# Patient Record
Sex: Female | Born: 1995 | Race: Black or African American | Hispanic: No | Marital: Single | Smoking: Never smoker
Health system: Southern US, Community
[De-identification: ages and names within clinical notes are randomized; demographics above are authoritative.]

---
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
## 2022-02-01 ENCOUNTER — Ambulatory Visit
Admission: EM | Admit: 2022-02-01 | Discharge: 2022-02-01 | Disposition: A | Discharge status: Home / Self Care | Payer: BC Managed Care – PPO | Attending: Emergency Medicine | Admitting: Emergency Medicine

## 2022-02-01 ENCOUNTER — Other Ambulatory Visit: Payer: Self-pay

## 2022-02-01 DIAGNOSIS — J020 Streptococcal pharyngitis: Secondary | ICD-10-CM | POA: Diagnosis not present

## 2022-02-01 LAB — GROUP A STREP BY PCR: Group A Strep by PCR: DETECTED — AB

## 2022-02-01 MED ORDER — AMOXICILLIN 250 MG/5ML PO SUSR: 500.0000 mg | Freq: Two times a day (BID) | ORAL | 0 refills | Status: AC

## 2022-02-01 MED ORDER — LIDOCAINE VISCOUS HCL 2 % MT SOLN: 15.0000 mL | OROMUCOSAL | 0 refills | Status: AC | PRN

## 2022-02-01 MED ORDER — IBUPROFEN 800 MG PO TABS: 800.0000 mg | ORAL_TABLET | Freq: Three times a day (TID) | ORAL | 0 refills | Status: AC

## 2022-02-01 NOTE — ED Triage Notes (Signed)
Patient presents to Urgent Care with complaints of sore throat, swollen lymph nodes, fatigue since yesterday. Treating symptoms with dayquil with no relief.

## 2022-02-01 NOTE — Discharge Instructions (Addendum)
Your strep test today was positive  Take amoxicillin twice a day for the next 10 days  You may gargle and spit lidocaine solution every 4 hours as needed for temporary relief of your sore throat  May use ibuprofen every 8 hours as needed in addition to Tylenol for additional comfort  You may follow-up at urgent care as needed

## 2022-02-01 NOTE — ED Provider Notes (Signed)
[facility]    CSN: [id number] Arrival date & time: 02/01/22  1828      History   Chief Complaint Chief Complaint  Patient presents with   Fever   Sore Throat    HPI Nicole Dickerson is a 26 y.o. female.   Patient presents with fever, sore throat, fatigue and swelling if notes for 1 day.  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  Unable to tolerate solid foods but tolerating softer foods and liquids.  Has attempted use of NyQuil which has not been helpful.  No pertinent medical history.  Denies nasal congestion, rhinorrhea, ear pain, headaches, cough, shortness of breath, wheezing, abdominal pain, nausea, vomiting, diarrhea.  History reviewed. No pertinent past medical history.  There are no problems to display for this patient.   History reviewed. No pertinent surgical history.  OB History   No obstetric history on file.      Home Medications    Prior to Admission medications   Not on File    Family History History reviewed. No pertinent family history.  Social History Social History   Tobacco Use   Smoking status: Never   Smokeless tobacco: Never  Vaping Use   Vaping Use: Never used  Substance Use Topics   Alcohol use: Yes    Comment: 1x a week   Drug use: Never     Allergies   Patient has no known allergies.   Review of Systems Review of Systems  Constitutional:  Positive for fever. Negative for activity change, appetite change, chills, diaphoresis, fatigue and unexpected weight change.  HENT:  Positive for sore throat. Negative for congestion, dental problem, drooling, ear discharge, ear pain, facial swelling, hearing loss, mouth sores, nosebleeds, postnasal drip, rhinorrhea, sinus pressure, sinus pain, sneezing, tinnitus, trouble swallowing and voice change.   Respiratory: Negative.    Cardiovascular: Negative.   Gastrointestinal: Negative.   Skin: Negative.   Neurological: Negative.     Physical Exam Triage Vital  Signs ED Triage Vitals  Enc Vitals Group     BP 02/01/22 1843 (!) 146/91     Pulse Rate 02/01/22 1843 (!) 117     Resp 02/01/22 1843 16     Temp 02/01/22 1843 (!) 101.3 F (38.5 C)     Temp Source 02/01/22 1843 Oral     SpO2 02/01/22 1843 98 %     Weight 02/01/22 1841 135 lb (61.2 kg)     Height 02/01/22 1841 5\' 3"  (1.6 m)     Head Circumference --      Peak Flow --      Pain Score 02/01/22 1841 6     Pain Loc --      Pain Edu? --      Excl. in GC? --    No data found.  Updated Vital Signs BP (!) 146/91 (BP Location: Right Arm)    Pulse (!) 117    Temp (!) 101.3 F (38.5 C) (Oral)    Resp 16    Ht 5\' 3"  (1.6 m)    Wt 135 lb (61.2 kg)    LMP 01/18/2022    SpO2 98%    BMI 23.91 kg/m   Visual Acuity Right Eye Distance:   Left Eye Distance:   Bilateral Distance:    Right Eye Near:   Left Eye Near:    Bilateral Near:     Physical Exam Constitutional:      Appearance: She is well-developed.  HENT:  Head: Normocephalic.     Right Ear: Tympanic membrane and ear canal normal.     Left Ear: Tympanic membrane and ear canal normal.     Nose: No congestion or rhinorrhea.     Mouth/Throat:     Mouth: Mucous membranes are moist.     Pharynx: Posterior oropharyngeal erythema present.     Tonsils: No tonsillar exudate. 1+ on the right. 1+ on the left.  Cardiovascular:     Rate and Rhythm: Regular rhythm. Tachycardia present.     Heart sounds: Normal heart sounds.  Pulmonary:     Effort: Pulmonary effort is normal.     Breath sounds: Normal breath sounds.  Musculoskeletal:     Cervical back: Normal range of motion.  Lymphadenopathy:     Cervical: Cervical adenopathy present.  Skin:    General: Skin is warm and dry.  Neurological:     General: No focal deficit present.     Mental Status: She is alert and oriented to person, place, and time.  Psychiatric:        Mood and Affect: Mood normal.        Behavior: Behavior normal.     UC Treatments / Results  Labs (all  labs ordered are listed, but only abnormal results are displayed) Labs Reviewed  GROUP A STREP BY PCR    EKG   Radiology No results found.  Procedures Procedures (including critical care time)  Medications Ordered in UC Medications - No data to display  Initial Impression / Assessment and Plan / UC Course  I have reviewed the triage vital signs and the nursing notes.  Pertinent labs & imaging results that were available during my care of the patient were reviewed by me and considered in my medical decision making (see chart for details).  Strep pharyngitis  Fever of 101.3 with associated tachycardia noted in triage, declined in office medication use, endorses that she will use Tylenol at home, strep confirmed via PCR, discussed findings with patient, amoxicillin 10-day course prescribed as well as lidocaine viscus and ibuprofen for management of fever and discomfort, may attempt use of salt water gargles, throat lozenges, warm liquids, teaspoons of honey in addition, urgent care follow-up as needed Final Clinical Impressions(s) / UC Diagnoses   Final diagnoses:  None   Discharge Instructions   None    ED Prescriptions   None    PDMP not reviewed this encounter.   Valinda Hoar, [place] 02/01/22 [phone]

## 2022-02-01 NOTE — ED Notes (Signed)
Refused Tylenol. Pt states she will wait till she gets home.

## 2022-02-23 ENCOUNTER — Other Ambulatory Visit: Payer: Self-pay

## 2022-02-23 ENCOUNTER — Ambulatory Visit
Admission: EM | Admit: 2022-02-23 | Discharge: 2022-02-23 | Disposition: A | Discharge status: Home / Self Care | Payer: BC Managed Care – PPO | Attending: Emergency Medicine | Admitting: Emergency Medicine

## 2022-02-23 DIAGNOSIS — J069 Acute upper respiratory infection, unspecified: Secondary | ICD-10-CM | POA: Diagnosis present

## 2022-02-23 LAB — GROUP A STREP BY PCR: Group A Strep by PCR: NOT DETECTED

## 2022-02-23 MED ORDER — IPRATROPIUM BROMIDE 0.06 % NA SOLN: 2.0000 | Freq: Four times a day (QID) | NASAL | 12 refills | Status: AC

## 2022-02-23 MED ORDER — PROMETHAZINE-DM 6.25-15 MG/5ML PO SYRP: 5.0000 mL | ORAL_SOLUTION | Freq: Four times a day (QID) | ORAL | 0 refills | Status: AC | PRN

## 2022-02-23 MED ORDER — BENZONATATE 100 MG PO CAPS: 200.0000 mg | ORAL_CAPSULE | Freq: Three times a day (TID) | ORAL | 0 refills | Status: AC

## 2022-02-23 NOTE — ED Provider Notes (Signed)
?[facility] ? ? ? ?CSN: [id number] ?Arrival date & time: 02/23/22  1109 ? ? ?  ? ?History   ?Chief Complaint ?Chief Complaint  ?Patient presents with  ? Sore Throat  ? ? ?HPI ?Nicole Dickerson is a 26 y.o. female.  ? ?HPI ? ?26 year old female here for evaluation of respiratory complaints. ? ?Patient ports that her symptoms started 3 days ago and consist of nasal congestion, headache, postnasal drip, sore throat, and minimally productive cough for green sputum.  She states she has had a low-grade temp and measured 99.9 here in clinic.  Patient denies any nasal discharge, ear pain, shortness of breath, wheezing, GI complaints. ? ?History reviewed. No pertinent past medical history. ? ?There are no problems to display for this patient. ? ? ?History reviewed. No pertinent surgical history. ? ?OB History   ?No obstetric history on file. ?  ? ? ? ?Home Medications   ? ?Prior to Admission medications   ?Medication Sig Start Date End Date Taking? Authorizing Provider  ?acetaminophen (TYLENOL) 325 MG tablet Take 650 mg by mouth every 6 (six) hours as needed.   Yes [provider]  ?benzonatate (TESSALON) 100 MG capsule Take 2 capsules (200 mg total) by mouth every 8 (eight) hours. 02/23/22  Yes Becky Augusta, NP  ?ipratropium (ATROVENT) 0.06 % nasal spray Place 2 sprays into both nostrils 4 (four) times daily. 02/23/22  Yes Becky Augusta, NP  ?promethazine-dextromethorphan (PROMETHAZINE-DM) 6.25-15 MG/5ML syrup Take 5 mLs by mouth 4 (four) times daily as needed. 02/23/22  Yes Becky Augusta, NP  ?ibuprofen (ADVIL) 800 MG tablet Take 1 tablet (800 mg total) by mouth 3 (three) times daily. 02/01/22   Valinda Hoar, NP  ?lidocaine (XYLOCAINE) 2 % solution Use as directed 15 mLs in the mouth or throat every 4 (four) hours as needed for mouth pain. 02/01/22   Valinda Hoar, NP  ? ? ?Family History ?History reviewed. No pertinent family history. ? ?Social History ?Social History  ? ?Tobacco Use  ? Smoking status: Never   ? Smokeless tobacco: Never  ?Vaping Use  ? Vaping Use: Never used  ?Substance Use Topics  ? Alcohol use: Yes  ?  Comment: 1x a week  ? Drug use: Never  ? ? ? ?Allergies   ?Patient has no known allergies. ? ? ?Review of Systems ?Review of Systems  ?Constitutional:  Positive for fever.  ?HENT:  Positive for congestion, postnasal drip and sore throat. Negative for ear pain and rhinorrhea.   ?Respiratory:  Positive for cough. Negative for shortness of breath and wheezing.   ?Gastrointestinal:  Negative for diarrhea, nausea and vomiting.  ?Skin:  Negative for rash.  ?Neurological:  Positive for headaches.  ?Hematological:  Positive for adenopathy.  ? ? ?Physical Exam ?Triage Vital Signs ?ED Triage Vitals  ?Enc Vitals Group  ?   BP 02/23/22 1124 107/69  ?   Pulse Rate 02/23/22 1124 88  ?   Resp 02/23/22 1124 18  ?   Temp 02/23/22 1124 99.9 ?F (37.7 ?C)  ?   Temp Source 02/23/22 1124 Oral  ?   SpO2 02/23/22 1124 98 %  ?   Weight 02/23/22 1122 135 lb (61.2 kg)  ?   Height --   ?   Head Circumference --   ?   Peak Flow --   ?   Pain Score 02/23/22 1122 6  ?   Pain Loc --   ?   Pain Edu? --   ?  Excl. in GC? --   ? ?No data found. ? ?Updated Vital Signs ?BP 107/69 (BP Location: Left Arm)   Pulse 88   Temp 99.9 ?F (37.7 ?C) (Oral)   Resp 18   Wt 135 lb (61.2 kg)   LMP 02/16/2022 (Exact Date)   SpO2 98%   BMI 23.91 kg/m?  ? ?Visual Acuity ?Right Eye Distance:   ?Left Eye Distance:   ?Bilateral Distance:   ? ?Right Eye Near:   ?Left Eye Near:    ?Bilateral Near:    ? ?Physical Exam ?Vitals and nursing note reviewed.  ?Constitutional:   ?   General: She is not in acute distress. ?   Appearance: Normal appearance. She is not ill-appearing.  ?HENT:  ?   Head: Normocephalic and atraumatic.  ?   Right Ear: Tympanic membrane, ear canal and external ear normal. There is no impacted cerumen.  ?   Left Ear: Tympanic membrane, ear canal and external ear normal. There is no impacted cerumen.  ?   Nose: Congestion and rhinorrhea  present.  ?   Mouth/Throat:  ?   Mouth: Mucous membranes are moist.  ?   Pharynx: Oropharynx is clear. Posterior oropharyngeal erythema present.  ?Cardiovascular:  ?   Rate and Rhythm: Normal rate and regular rhythm.  ?   Pulses: Normal pulses.  ?   Heart sounds: Normal heart sounds. No murmur heard. ?  No friction rub. No gallop.  ?Pulmonary:  ?   Effort: Pulmonary effort is normal.  ?   Breath sounds: Normal breath sounds. No wheezing, rhonchi or rales.  ?Musculoskeletal:  ?   Cervical back: Normal range of motion and neck supple.  ?Lymphadenopathy:  ?   Cervical: Cervical adenopathy present.  ?Skin: ?   General: Skin is warm and dry.  ?   Capillary Refill: Capillary refill takes less than 2 seconds.  ?   Findings: No erythema or rash.  ?Neurological:  ?   General: No focal deficit present.  ?   Mental Status: She is alert and oriented to person, place, and time.  ?Psychiatric:     ?   Mood and Affect: Mood normal.     ?   Behavior: Behavior normal.     ?   Thought Content: Thought content normal.     ?   Judgment: Judgment normal.  ? ? ? ?UC Treatments / Results  ?Labs ?(all labs ordered are listed, but only abnormal results are displayed) ?Labs Reviewed  ?GROUP A STREP BY PCR  ? ? ?EKG ? ? ?Radiology ?No results found. ? ?Procedures ?Procedures (including critical care time) ? ?Medications Ordered in UC ?Medications - No data to display ? ?Initial Impression / Assessment and Plan / UC Course  ?I have reviewed the triage vital signs and the nursing notes. ? ?Pertinent labs & imaging results that were available during my care of the patient were reviewed by me and considered in my medical decision making (see chart for details). ? ?Patient is a nontoxic-appearing 26 year old female here for evaluation of respiratory complaints as outlined HPI above.  Her physical exam reveals protegrin tympanic membranes bilaterally with normal light reflex and clear external auditory canals.  Nasal mucosa is erythematous and  edematous with clear discharge in both nares.  Oropharyngeal exam reveals mild posterior oropharyngeal erythema with clear postnasal drip.  Patient does have bilateral anterior cervical lymphadenopathy on exam.  Cardiopulmonary exam feels clung sounds in all fields.  Strep PCR was collected at  triage and is negative.  Patient's exam is consistent with a viral URI with a cough and I will treat her with Atrovent nasal spray, Tessalon Perles, and Promethazine DM cough syrup.  Patient denies any need for work note.  Patient asked if an antibiotic was out of the question and I told her yes at this time I do not think that she needs it. ? ? ?Final Clinical Impressions(s) / UC Diagnoses  ? ?Final diagnoses:  ?Viral URI with cough  ? ? ? ?Discharge Instructions   ? ?  ?Use the Atrovent nasal spray, 2 squirts in each nostril every 6 hours, as needed for runny nose and postnasal drip. ? ?Use the Tessalon Perles every 8 hours during the day.  Take them with a small sip of water.  They may give you some numbness to the base of your tongue or a metallic taste in your mouth, this is normal. ? ?Use the Promethazine DM cough syrup at bedtime for cough and congestion.  It will make you drowsy so do not take it during the day. ? ?Use OTC Tylenol and Ibuprofen as needed for pain and fever. ? ?Return for reevaluation or see your primary care provider for any new or worsening symptoms.  ? ? ? ? ?ED Prescriptions   ? ? Medication Sig Dispense Auth. Provider  ? benzonatate (TESSALON) 100 MG capsule Take 2 capsules (200 mg total) by mouth every 8 (eight) hours. 21 capsule Becky Augusta, NP  ? ipratropium (ATROVENT) 0.06 % nasal spray Place 2 sprays into both nostrils 4 (four) times daily. 15 mL Becky Augusta, NP  ? promethazine-dextromethorphan (PROMETHAZINE-DM) 6.25-15 MG/5ML syrup Take 5 mLs by mouth 4 (four) times daily as needed. 118 mL Becky Augusta, NP  ? ?  ? ?PDMP not reviewed this encounter. ?  ?Becky Augusta, NP ?02/23/22 1226 ? ?

## 2022-02-23 NOTE — ED Triage Notes (Signed)
Patient is here for S/t started on Monday, Tuesday. "Lymph nodes in neck hurt". Some "congestion and post nasal drip as well". Recent strep throat around birthday.  ?

## 2022-02-23 NOTE — Discharge Instructions (Addendum)
Use the Atrovent nasal spray, 2 squirts in each nostril every 6 hours, as needed for runny nose and postnasal drip. ° °Use the Tessalon Perles every 8 hours during the day.  Take them with a small sip of water.  They may give you some numbness to the base of your tongue or a metallic taste in your mouth, this is normal. ° °Use the Promethazine DM cough syrup at bedtime for cough and congestion.  It will make you drowsy so do not take it during the day. ° °Use OTC Tylenol and Ibuprofen as needed for pain and fever.  ° °Return for reevaluation or see your primary care provider for any new or worsening symptoms.  °

## 2022-05-15 ENCOUNTER — Ambulatory Visit: Payer: Self-pay | Admitting: Nurse Practitioner
